# Patient Record
Sex: Female | Born: 1955
Health system: Southern US, Community
[De-identification: ages and names within clinical notes are randomized; demographics above are authoritative.]

## PROBLEM LIST (undated history)

## (undated) DIAGNOSIS — I10 Essential (primary) hypertension: Secondary | ICD-10-CM

## (undated) DIAGNOSIS — E119 Type 2 diabetes mellitus without complications: Secondary | ICD-10-CM

## (undated) DIAGNOSIS — E7800 Pure hypercholesterolemia, unspecified: Secondary | ICD-10-CM

## (undated) HISTORY — PX: CHOLECYSTECTOMY: SHX55

## (undated) HISTORY — PX: HERNIA REPAIR: SHX51

## (undated) HISTORY — PX: OTHER SURGICAL HISTORY: SHX169

## (undated) HISTORY — PX: COLONOSCOPY: SHX174

## (undated) HISTORY — PX: UPPER GASTROINTESTINAL ENDOSCOPY: SHX188

---

## 2012-08-28 DIAGNOSIS — R079 Chest pain, unspecified: Secondary | ICD-10-CM

## 2016-10-16 DIAGNOSIS — Z0001 Encounter for general adult medical examination with abnormal findings: Secondary | ICD-10-CM | POA: Diagnosis not present

## 2016-10-19 DIAGNOSIS — Z0001 Encounter for general adult medical examination with abnormal findings: Secondary | ICD-10-CM | POA: Diagnosis not present

## 2016-10-19 DIAGNOSIS — Z6834 Body mass index (BMI) 34.0-34.9, adult: Secondary | ICD-10-CM | POA: Diagnosis not present

## 2016-10-19 DIAGNOSIS — Z23 Encounter for immunization: Secondary | ICD-10-CM | POA: Diagnosis not present

## 2016-11-21 DIAGNOSIS — Z1231 Encounter for screening mammogram for malignant neoplasm of breast: Secondary | ICD-10-CM | POA: Diagnosis not present

## 2016-12-14 DIAGNOSIS — Z008 Encounter for other general examination: Secondary | ICD-10-CM | POA: Diagnosis not present

## 2016-12-14 DIAGNOSIS — Z6832 Body mass index (BMI) 32.0-32.9, adult: Secondary | ICD-10-CM | POA: Diagnosis not present

## 2016-12-14 DIAGNOSIS — E669 Obesity, unspecified: Secondary | ICD-10-CM | POA: Diagnosis not present

## 2016-12-14 DIAGNOSIS — Z719 Counseling, unspecified: Secondary | ICD-10-CM | POA: Diagnosis not present

## 2016-12-14 DIAGNOSIS — Z1389 Encounter for screening for other disorder: Secondary | ICD-10-CM | POA: Diagnosis not present

## 2017-03-27 DIAGNOSIS — Z6832 Body mass index (BMI) 32.0-32.9, adult: Secondary | ICD-10-CM | POA: Diagnosis not present

## 2017-03-27 DIAGNOSIS — Z008 Encounter for other general examination: Secondary | ICD-10-CM | POA: Diagnosis not present

## 2017-03-27 DIAGNOSIS — E669 Obesity, unspecified: Secondary | ICD-10-CM | POA: Diagnosis not present

## 2017-03-27 DIAGNOSIS — Z719 Counseling, unspecified: Secondary | ICD-10-CM | POA: Diagnosis not present

## 2017-05-08 DIAGNOSIS — Z7689 Persons encountering health services in other specified circumstances: Secondary | ICD-10-CM | POA: Diagnosis not present

## 2017-05-08 DIAGNOSIS — E669 Obesity, unspecified: Secondary | ICD-10-CM | POA: Diagnosis not present

## 2017-05-08 DIAGNOSIS — Z6831 Body mass index (BMI) 31.0-31.9, adult: Secondary | ICD-10-CM | POA: Diagnosis not present

## 2017-05-29 DIAGNOSIS — Z6831 Body mass index (BMI) 31.0-31.9, adult: Secondary | ICD-10-CM | POA: Diagnosis not present

## 2017-05-29 DIAGNOSIS — E669 Obesity, unspecified: Secondary | ICD-10-CM | POA: Diagnosis not present

## 2017-05-29 DIAGNOSIS — Z7689 Persons encountering health services in other specified circumstances: Secondary | ICD-10-CM | POA: Diagnosis not present

## 2017-06-12 DIAGNOSIS — E669 Obesity, unspecified: Secondary | ICD-10-CM | POA: Diagnosis not present

## 2017-06-12 DIAGNOSIS — Z7689 Persons encountering health services in other specified circumstances: Secondary | ICD-10-CM | POA: Diagnosis not present

## 2017-06-12 DIAGNOSIS — Z6831 Body mass index (BMI) 31.0-31.9, adult: Secondary | ICD-10-CM | POA: Diagnosis not present

## 2017-06-19 DIAGNOSIS — Z6831 Body mass index (BMI) 31.0-31.9, adult: Secondary | ICD-10-CM | POA: Diagnosis not present

## 2017-06-19 DIAGNOSIS — Z7689 Persons encountering health services in other specified circumstances: Secondary | ICD-10-CM | POA: Diagnosis not present

## 2017-06-19 DIAGNOSIS — E669 Obesity, unspecified: Secondary | ICD-10-CM | POA: Diagnosis not present

## 2017-07-24 ENCOUNTER — Emergency Department (HOSPITAL_COMMUNITY): Payer: BLUE CROSS/BLUE SHIELD

## 2017-07-24 ENCOUNTER — Other Ambulatory Visit: Payer: Self-pay

## 2017-07-24 ENCOUNTER — Encounter (HOSPITAL_COMMUNITY): Payer: Self-pay | Admitting: Emergency Medicine

## 2017-07-24 ENCOUNTER — Emergency Department (HOSPITAL_COMMUNITY)
Admission: EM | Admit: 2017-07-24 | Discharge: 2017-07-24 | Disposition: A | Discharge status: Home / Self Care | Payer: BLUE CROSS/BLUE SHIELD | Attending: Emergency Medicine | Admitting: Emergency Medicine

## 2017-07-24 DIAGNOSIS — R079 Chest pain, unspecified: Secondary | ICD-10-CM

## 2017-07-24 DIAGNOSIS — E119 Type 2 diabetes mellitus without complications: Secondary | ICD-10-CM | POA: Diagnosis not present

## 2017-07-24 DIAGNOSIS — R0789 Other chest pain: Secondary | ICD-10-CM | POA: Diagnosis not present

## 2017-07-24 DIAGNOSIS — I10 Essential (primary) hypertension: Secondary | ICD-10-CM | POA: Diagnosis not present

## 2017-07-24 HISTORY — DX: Type 2 diabetes mellitus without complications: E11.9

## 2017-07-24 HISTORY — DX: Essential (primary) hypertension: I10

## 2017-07-24 HISTORY — DX: Pure hypercholesterolemia, unspecified: E78.00

## 2017-07-24 LAB — BASIC METABOLIC PANEL
Anion gap: 13 (ref 5–15)
BUN: 13 mg/dL (ref 6–20)
CO2: 29 mmol/L (ref 22–32)
Calcium: 9.7 mg/dL (ref 8.9–10.3)
Chloride: 96 mmol/L — ABNORMAL LOW (ref 101–111)
Creatinine, Ser: 0.65 mg/dL (ref 0.44–1.00)
GFR calc Af Amer: >60 mL/min (ref >60)
GFR calc non Af Amer: >60 mL/min (ref >60)
Glucose, Bld: 97 mg/dL (ref 65–99)
Potassium: 3 mmol/L — ABNORMAL LOW (ref 3.5–5.1)
Sodium: 138 mmol/L (ref 135–145)

## 2017-07-24 LAB — CBC
HCT: 47.1 % — ABNORMAL HIGH (ref 36.0–46.0)
Hemoglobin: 14.9 g/dL (ref 12.0–15.0)
MCH: 28.2 pg (ref 26.0–34.0)
MCHC: 31.6 g/dL (ref 30.0–36.0)
MCV: 89.2 fL (ref 78.0–100.0)
Platelets: 312 10*3/uL (ref 150–400)
RBC: 5.28 MIL/uL — ABNORMAL HIGH (ref 3.87–5.11)
RDW: 12.8 % (ref 11.5–15.5)
WBC: 10.6 10*3/uL — ABNORMAL HIGH (ref 4.0–10.5)

## 2017-07-24 LAB — TROPONIN I: Troponin I: <0.03 ng/mL (ref <0.03)

## 2017-07-24 MED ORDER — POTASSIUM CHLORIDE CRYS ER 20 MEQ PO TBCR
60.0000 meq | EXTENDED_RELEASE_TABLET | Freq: Once | ORAL | Status: AC
  Administered 2017-07-24: 60 meq via ORAL
  Filled 2017-07-24: qty 3

## 2017-07-24 NOTE — ED Triage Notes (Signed)
Patient reports constant L sided chest pain x 3 days. Char as "soreness." No SOB, dizziness, no emesis. No diaphoresis. Patient stated she has felt weak.

## 2017-07-25 NOTE — ED Provider Notes (Signed)
[facility] EMERGENCY DEPARTMENT Provider Note   CSN: [id number] Arrival date & time: 07/24/17  1322     History   Chief Complaint Chief Complaint  Patient presents with  . Chest Pain    HPI Margaret Holland is a 62 y.o. female.  HPI   62 year old female with chest pain.  Constant for the past 3 days.  She cannot remember she was doing she first noticed it.  Describes the pain as a "soreness."  Left anterior chest.  Worse with certain movements and pressing her area.  No respiratory complaints.  No nausea, diaphoresis or palpitations.  No unusual leg pain or swelling.  Past Medical History:  Diagnosis Date  . Diabetes mellitus without complication (HCC)    Pre diabetic  . Hypercholesterolemia   . Hypertension     There are no active problems to display for this patient.   Past Surgical History:  Procedure Laterality Date  . CHOLECYSTECTOMY    . Ectopin pregnancy    . HERNIA REPAIR      OB History    Gravida Para Term Preterm AB Living   3 2 2   1      SAB TAB Ectopic Multiple Live Births       1           Home Medications    Prior to Admission medications   Not on File    Family History Family History  Problem Relation Age of Onset  . Diabetic kidney disease Mother   . Heart disease Mother   . Heart disease Father     Social History Social History   Tobacco Use  . Smoking status: Never Smoker  . Smokeless tobacco: Never Used  Substance Use Topics  . Alcohol use: Yes    Comment: occas  . Drug use: No     Allergies   Patient has no known allergies.   Review of Systems Review of Systems   Physical Exam Updated Vital Signs BP (!) 109/57   Pulse 74   Temp 97.6 F (36.4 C) (Oral) Comment: Patient drinking cold water prior to Triage  Resp 15   Ht 5\' 2"  (1.575 m)   Wt 80.3 kg (177 lb)   SpO2 98%   BMI 32.37 kg/m   Physical Exam  Constitutional: She appears well-developed and well-nourished. No distress.  HENT:  Head:  Normocephalic and atraumatic.  Eyes: Conjunctivae are normal. Right eye exhibits no discharge. Left eye exhibits no discharge.  Neck: Neck supple.  Cardiovascular: Normal rate, regular rhythm and normal heart sounds. Exam reveals no gallop and no friction rub.  No murmur heard. Pulmonary/Chest: Effort normal and breath sounds normal. No respiratory distress. She exhibits tenderness.  Mild tenderness to palpation left anterior chest a few centimeters below the clavicle near the midclavicular line.  No overlying skin changes.  Abdominal: Soft. She exhibits no distension. There is no tenderness.  Musculoskeletal: She exhibits no edema or tenderness.  Lower extremities symmetric as compared to each other. No calf tenderness. Negative Homan's. No palpable cords.   Neurological: She is alert.  Skin: Skin is warm and dry.  Psychiatric: She has a normal mood and affect. Her behavior is normal. Thought content normal.  Nursing note and vitals reviewed.    ED Treatments / Results  Labs (all labs ordered are listed, but only abnormal results are displayed) Labs Reviewed  BASIC METABOLIC PANEL - Abnormal; Notable for the following components:      Result Value  Potassium 3.0 (*)    Chloride 96 (*)    All other components within normal limits  CBC - Abnormal; Notable for the following components:   WBC 10.6 (*)    RBC 5.28 (*)    HCT 47.1 (*)    All other components within normal limits  TROPONIN I    EKG  EKG Interpretation  Date/Time:  Tuesday July 24 2017 13:29:04 EST Ventricular Rate:  75 PR Interval:  138 QRS Duration: 82 QT Interval:  372 QTC Calculation: 415 R Axis:   32 Text Interpretation:  Normal sinus rhythm Non-specific ST-t changes No old tracing to compare Confirmed by Raeford RazorKohut, Wednesday Ericsson [phone]([id number]) on 07/24/2017 5:31:30 PM       Radiology Dg Chest 2 View  Result Date: 07/24/2017 CLINICAL DATA:  LEFT chest pain for 3-4 days. History of hypertension. EXAM: CHEST  2  VIEW COMPARISON:  None. FINDINGS: Cardiomediastinal silhouette is normal. Mildly tortuous aorta associated with chronic hypertension. Mild bronchitic changes. No pleural effusions or focal consolidations. Trachea projects midline and there is no pneumothorax. Soft tissue planes and included osseous structures are non-suspicious. Osteopenia. Surgical clips in the included right abdomen compatible with cholecystectomy. IMPRESSION: Mild bronchitic changes without focal consolidation. Electronically Signed   By: Awilda Metroourtnay  Bloomer M.D.   On: 07/24/2017 14:26    Procedures Procedures (including critical care time)  Medications Ordered in ED Medications  potassium chloride SA (K-DUR,KLOR-CON) CR tablet 60 mEq (60 mEq Oral Given 07/24/17 1742)     Initial Impression / Assessment and Plan / ED Course  I have reviewed the triage vital signs and the nursing notes.  Pertinent labs & imaging results that were available during my care of the patient were reviewed by me and considered in my medical decision making (see chart for details).     62 year old female with chest pain.  Doubt ACS, PE, dissection or other emergent pathology.  May potentially be musculoskeletal given the reproducibility with certain movements and also with palpation.  ED workup fairly unremarkable.  Return precautions were discussed.  Final Clinical Impressions(s) / ED Diagnoses   Final diagnoses:  Chest pain, unspecified type    ED Discharge Orders    None       Raeford RazorKohut, Squire Withey, MD 07/25/17 2122

## 2017-07-30 DIAGNOSIS — M19012 Primary osteoarthritis, left shoulder: Secondary | ICD-10-CM | POA: Diagnosis not present

## 2017-07-30 DIAGNOSIS — M5412 Radiculopathy, cervical region: Secondary | ICD-10-CM | POA: Diagnosis not present

## 2017-07-30 DIAGNOSIS — M7542 Impingement syndrome of left shoulder: Secondary | ICD-10-CM | POA: Diagnosis not present

## 2017-07-30 DIAGNOSIS — Z6833 Body mass index (BMI) 33.0-33.9, adult: Secondary | ICD-10-CM | POA: Diagnosis not present

## 2017-08-29 DIAGNOSIS — K219 Gastro-esophageal reflux disease without esophagitis: Secondary | ICD-10-CM | POA: Diagnosis not present

## 2017-08-29 DIAGNOSIS — I10 Essential (primary) hypertension: Secondary | ICD-10-CM | POA: Diagnosis not present

## 2017-08-29 DIAGNOSIS — R079 Chest pain, unspecified: Secondary | ICD-10-CM | POA: Diagnosis not present

## 2017-08-29 DIAGNOSIS — M50122 Cervical disc disorder at C5-C6 level with radiculopathy: Secondary | ICD-10-CM | POA: Diagnosis not present

## 2017-08-29 DIAGNOSIS — M542 Cervicalgia: Secondary | ICD-10-CM | POA: Diagnosis not present

## 2017-08-29 DIAGNOSIS — M1990 Unspecified osteoarthritis, unspecified site: Secondary | ICD-10-CM | POA: Diagnosis not present

## 2017-08-29 DIAGNOSIS — E7800 Pure hypercholesterolemia, unspecified: Secondary | ICD-10-CM | POA: Diagnosis not present

## 2017-08-29 DIAGNOSIS — Z79899 Other long term (current) drug therapy: Secondary | ICD-10-CM | POA: Diagnosis not present

## 2017-10-18 DIAGNOSIS — Z0001 Encounter for general adult medical examination with abnormal findings: Secondary | ICD-10-CM | POA: Diagnosis not present

## 2017-11-12 DIAGNOSIS — M5412 Radiculopathy, cervical region: Secondary | ICD-10-CM | POA: Diagnosis not present

## 2017-11-12 DIAGNOSIS — M50321 Other cervical disc degeneration at C4-C5 level: Secondary | ICD-10-CM | POA: Diagnosis not present

## 2017-11-12 DIAGNOSIS — M50322 Other cervical disc degeneration at C5-C6 level: Secondary | ICD-10-CM | POA: Diagnosis not present

## 2017-11-12 DIAGNOSIS — M50323 Other cervical disc degeneration at C6-C7 level: Secondary | ICD-10-CM | POA: Diagnosis not present

## 2017-11-14 DIAGNOSIS — M542 Cervicalgia: Secondary | ICD-10-CM | POA: Diagnosis not present

## 2017-11-14 DIAGNOSIS — M5412 Radiculopathy, cervical region: Secondary | ICD-10-CM | POA: Diagnosis not present

## 2017-11-14 DIAGNOSIS — M5033 Other cervical disc degeneration, cervicothoracic region: Secondary | ICD-10-CM | POA: Diagnosis not present

## 2017-11-14 DIAGNOSIS — M4802 Spinal stenosis, cervical region: Secondary | ICD-10-CM | POA: Diagnosis not present

## 2018-01-04 DIAGNOSIS — Z1231 Encounter for screening mammogram for malignant neoplasm of breast: Secondary | ICD-10-CM | POA: Diagnosis not present

## 2018-11-01 DIAGNOSIS — Z0001 Encounter for general adult medical examination with abnormal findings: Secondary | ICD-10-CM | POA: Diagnosis not present

## 2018-11-05 DIAGNOSIS — Z23 Encounter for immunization: Secondary | ICD-10-CM | POA: Diagnosis not present

## 2018-11-05 DIAGNOSIS — Z6835 Body mass index (BMI) 35.0-35.9, adult: Secondary | ICD-10-CM | POA: Diagnosis not present

## 2018-11-05 DIAGNOSIS — Z0001 Encounter for general adult medical examination with abnormal findings: Secondary | ICD-10-CM | POA: Diagnosis not present

## 2019-01-16 DIAGNOSIS — Z23 Encounter for immunization: Secondary | ICD-10-CM | POA: Diagnosis not present

## 2020-11-09 ENCOUNTER — Other Ambulatory Visit (HOSPITAL_COMMUNITY): Payer: Self-pay | Admitting: Internal Medicine

## 2020-11-09 DIAGNOSIS — Z1231 Encounter for screening mammogram for malignant neoplasm of breast: Secondary | ICD-10-CM

## 2020-11-22 ENCOUNTER — Ambulatory Visit (HOSPITAL_COMMUNITY)
Admission: RE | Admit: 2020-11-22 | Discharge: 2020-11-22 | Disposition: A | Discharge status: Home / Self Care | Payer: 59 | Source: Ambulatory Visit | Attending: Internal Medicine | Admitting: Internal Medicine

## 2020-11-22 ENCOUNTER — Other Ambulatory Visit: Payer: Self-pay

## 2020-11-22 DIAGNOSIS — Z1231 Encounter for screening mammogram for malignant neoplasm of breast: Secondary | ICD-10-CM | POA: Insufficient documentation

## 2021-02-16 DIAGNOSIS — J302 Other seasonal allergic rhinitis: Secondary | ICD-10-CM | POA: Diagnosis not present

## 2021-02-16 DIAGNOSIS — R7303 Prediabetes: Secondary | ICD-10-CM | POA: Diagnosis not present

## 2021-02-16 DIAGNOSIS — K589 Irritable bowel syndrome without diarrhea: Secondary | ICD-10-CM | POA: Diagnosis not present

## 2021-02-16 DIAGNOSIS — I10 Essential (primary) hypertension: Secondary | ICD-10-CM | POA: Diagnosis not present

## 2021-05-17 DIAGNOSIS — K589 Irritable bowel syndrome without diarrhea: Secondary | ICD-10-CM | POA: Diagnosis not present

## 2021-05-17 DIAGNOSIS — J302 Other seasonal allergic rhinitis: Secondary | ICD-10-CM | POA: Diagnosis not present

## 2021-05-17 DIAGNOSIS — Z23 Encounter for immunization: Secondary | ICD-10-CM | POA: Diagnosis not present

## 2021-05-17 DIAGNOSIS — I10 Essential (primary) hypertension: Secondary | ICD-10-CM | POA: Diagnosis not present

## 2021-05-17 DIAGNOSIS — R7303 Prediabetes: Secondary | ICD-10-CM | POA: Diagnosis not present

## 2021-08-26 DIAGNOSIS — M810 Age-related osteoporosis without current pathological fracture: Secondary | ICD-10-CM | POA: Diagnosis not present

## 2021-08-26 DIAGNOSIS — M8589 Other specified disorders of bone density and structure, multiple sites: Secondary | ICD-10-CM | POA: Diagnosis not present

## 2021-11-07 DIAGNOSIS — Z13220 Encounter for screening for lipoid disorders: Secondary | ICD-10-CM | POA: Diagnosis not present

## 2021-11-07 DIAGNOSIS — E7800 Pure hypercholesterolemia, unspecified: Secondary | ICD-10-CM | POA: Diagnosis not present

## 2021-11-07 DIAGNOSIS — R7303 Prediabetes: Secondary | ICD-10-CM | POA: Diagnosis not present

## 2021-11-07 DIAGNOSIS — R739 Hyperglycemia, unspecified: Secondary | ICD-10-CM | POA: Diagnosis not present

## 2021-11-07 DIAGNOSIS — I10 Essential (primary) hypertension: Secondary | ICD-10-CM | POA: Diagnosis not present

## 2021-11-07 DIAGNOSIS — E1165 Type 2 diabetes mellitus with hyperglycemia: Secondary | ICD-10-CM | POA: Diagnosis not present

## 2021-11-07 DIAGNOSIS — E559 Vitamin D deficiency, unspecified: Secondary | ICD-10-CM | POA: Diagnosis not present

## 2021-11-07 DIAGNOSIS — Z1329 Encounter for screening for other suspected endocrine disorder: Secondary | ICD-10-CM | POA: Diagnosis not present

## 2021-11-14 DIAGNOSIS — I10 Essential (primary) hypertension: Secondary | ICD-10-CM | POA: Diagnosis not present

## 2021-11-14 DIAGNOSIS — R7303 Prediabetes: Secondary | ICD-10-CM | POA: Diagnosis not present

## 2021-11-14 DIAGNOSIS — Z0001 Encounter for general adult medical examination with abnormal findings: Secondary | ICD-10-CM | POA: Diagnosis not present

## 2021-11-14 DIAGNOSIS — K589 Irritable bowel syndrome without diarrhea: Secondary | ICD-10-CM | POA: Diagnosis not present

## 2021-11-14 DIAGNOSIS — J302 Other seasonal allergic rhinitis: Secondary | ICD-10-CM | POA: Diagnosis not present

## 2022-01-23 DIAGNOSIS — H25813 Combined forms of age-related cataract, bilateral: Secondary | ICD-10-CM | POA: Diagnosis not present

## 2022-01-23 DIAGNOSIS — Z7984 Long term (current) use of oral hypoglycemic drugs: Secondary | ICD-10-CM | POA: Diagnosis not present

## 2022-01-23 DIAGNOSIS — E119 Type 2 diabetes mellitus without complications: Secondary | ICD-10-CM | POA: Diagnosis not present

## 2022-02-21 DIAGNOSIS — Z1231 Encounter for screening mammogram for malignant neoplasm of breast: Secondary | ICD-10-CM | POA: Diagnosis not present

## 2022-03-18 IMAGING — MG MM DIGITAL SCREENING BILAT W/ TOMO AND CAD
8 series · 8 of 24 positions shown · non-contrast
Comparison: Previous exam(s).

CLINICAL DATA: Screening.

EXAM:
DIGITAL SCREENING BILATERAL MAMMOGRAM WITH TOMOSYNTHESIS AND CAD
TECHNIQUE: Bilateral screening digital craniocaudal and mediolateral oblique
mammograms were obtained. Bilateral screening digital breast
tomosynthesis was performed. The images were evaluated with
computer-aided detection.

[L CC synth-2D]
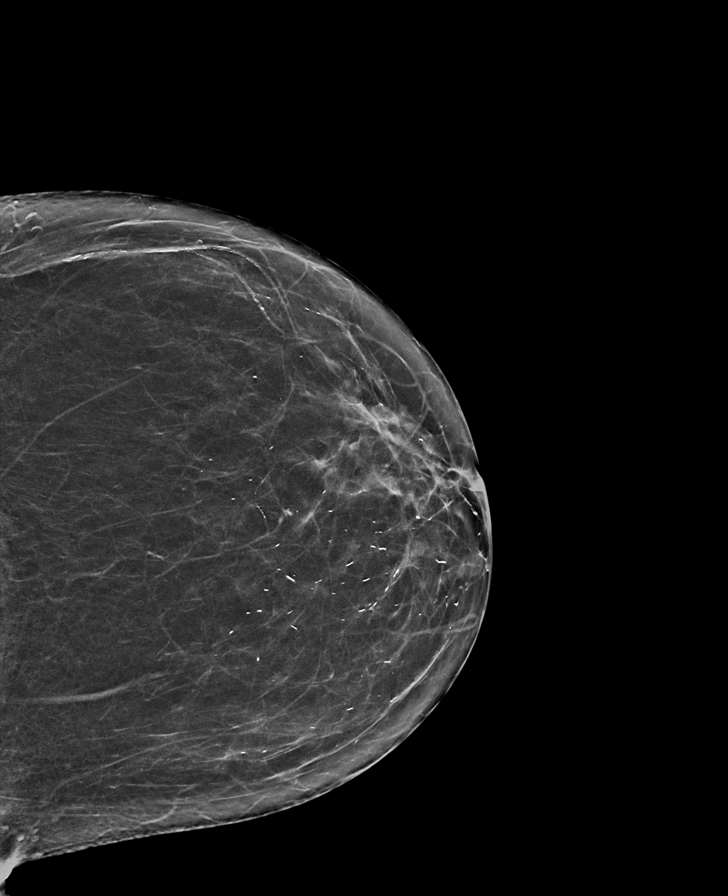

[R CC synth-2D]
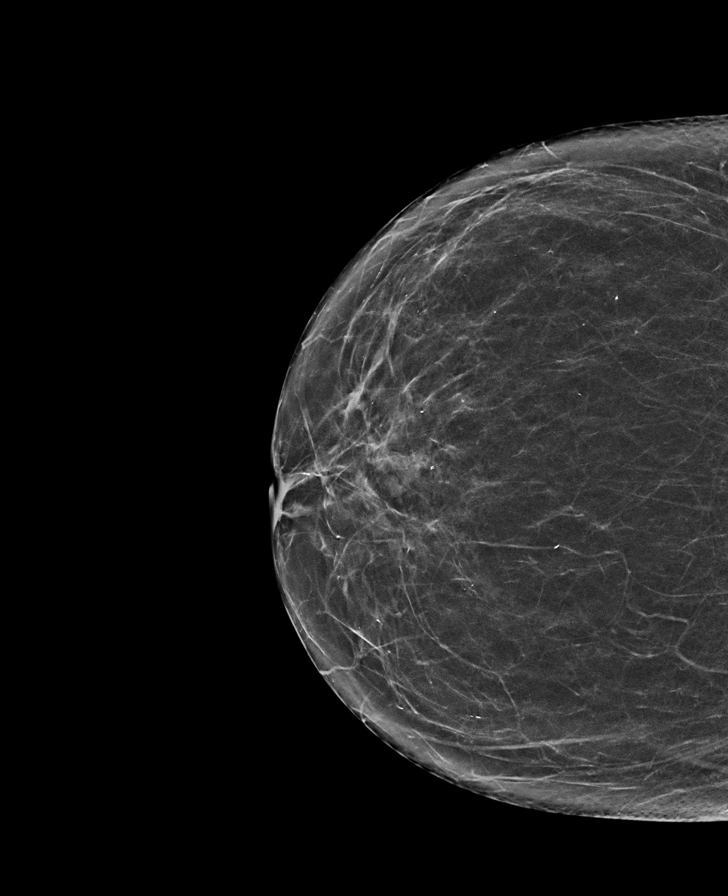

[L MLO synth-2D]
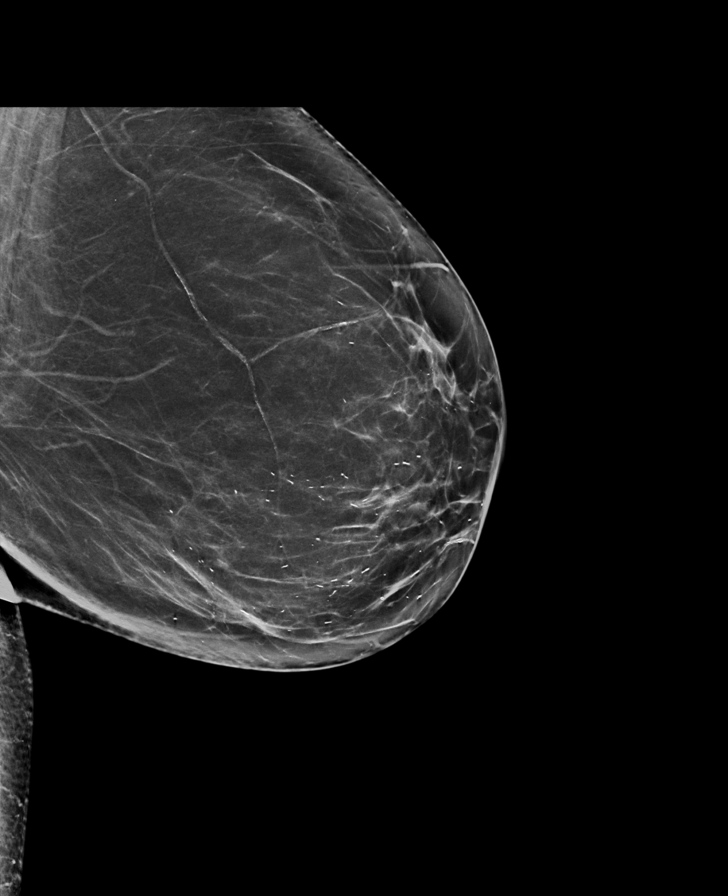

[R MLO synth-2D]
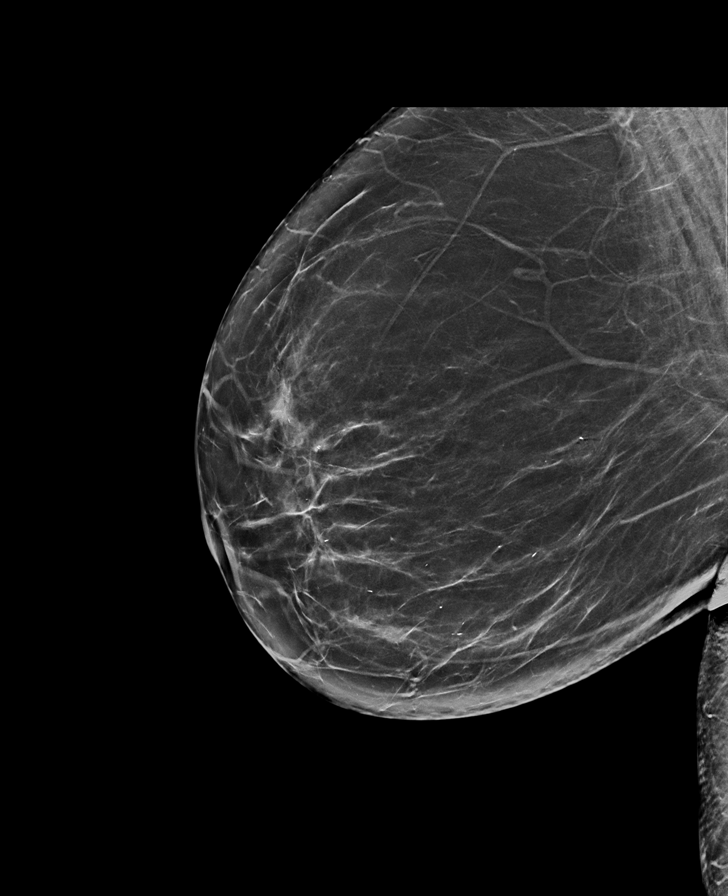

[R CC tomo · tomo slice 33/64.0]
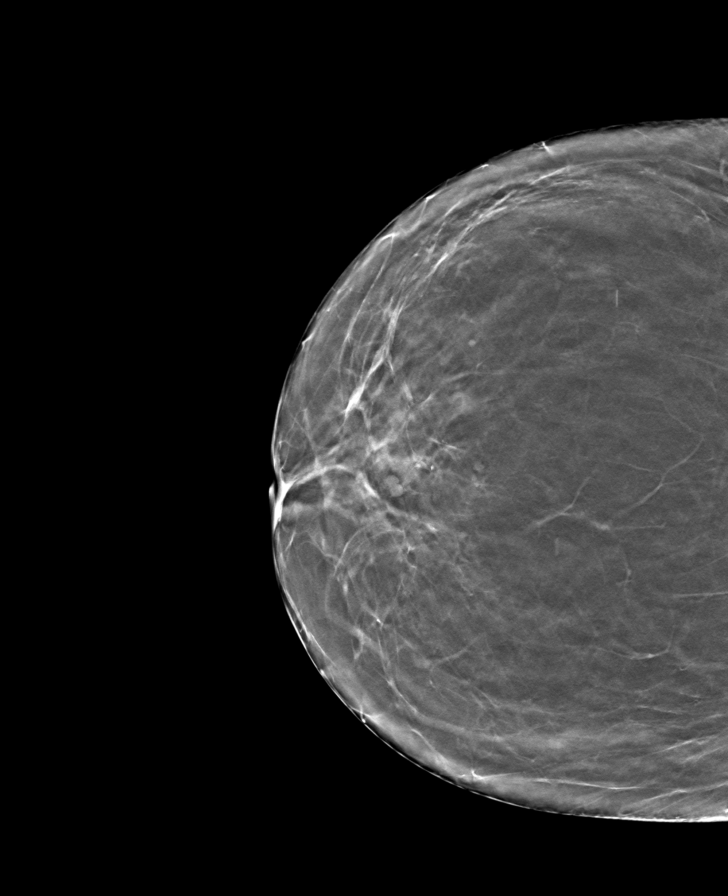

[L MLO tomo · tomo slice 39/76.0]
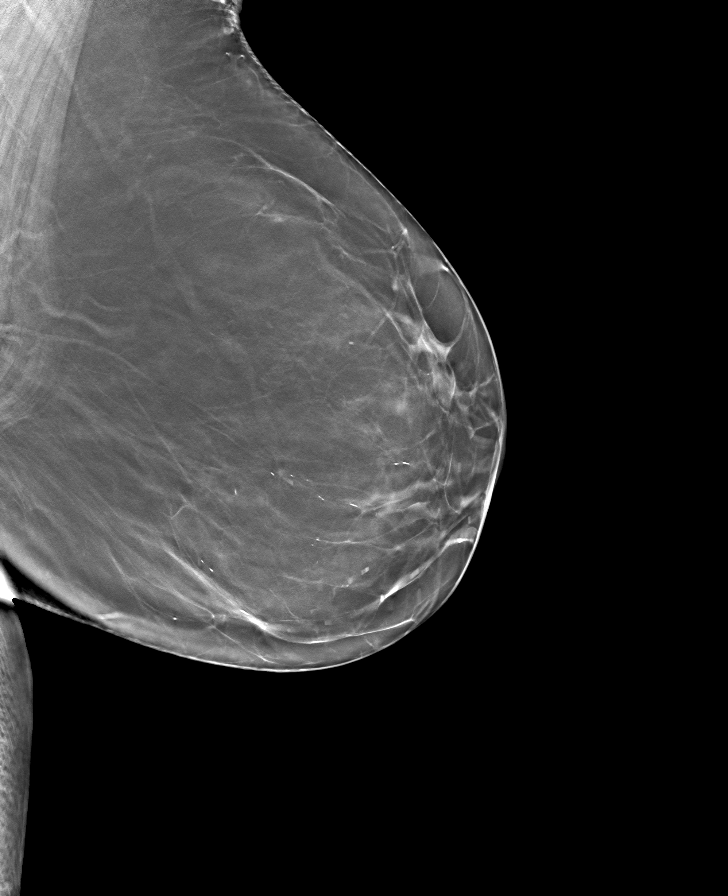

[L CC tomo · tomo slice 32/63.0]
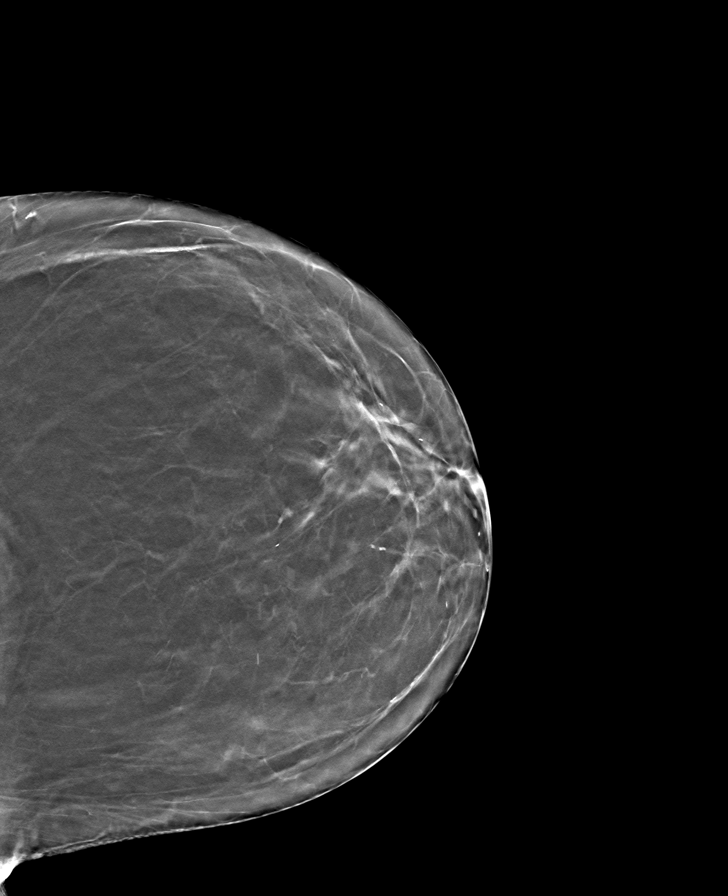

[R MLO tomo · tomo slice 39/76.0]
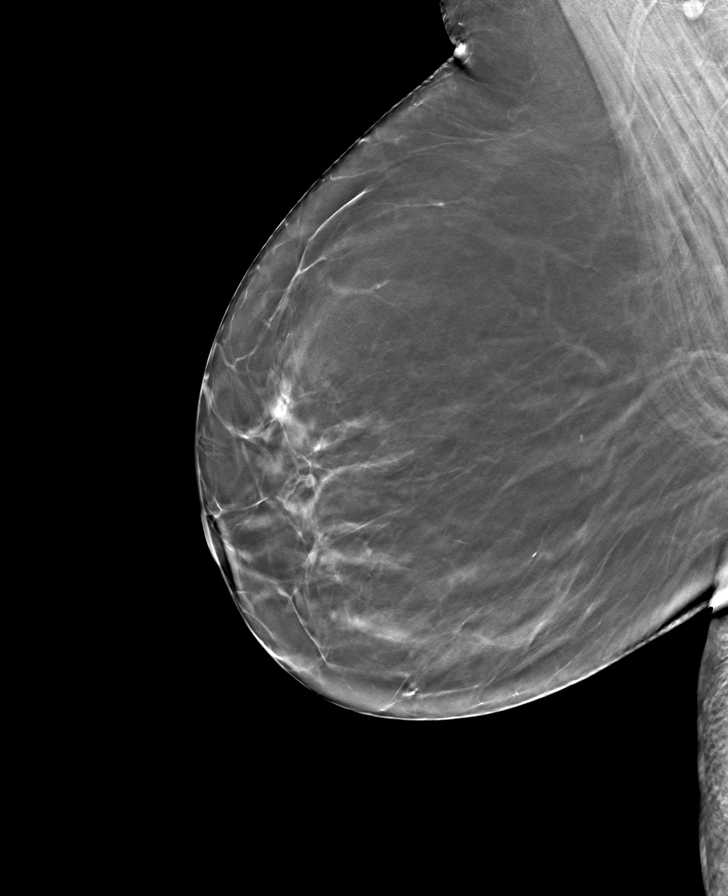

[8 of 24 positions shown; findings below may reference images not displayed]

ACR Breast Density Category b: There are scattered areas of
fibroglandular density.
FINDINGS: There are no findings suspicious for malignancy. The images were
evaluated with computer-aided detection.
IMPRESSION: No mammographic evidence of malignancy. A result letter of this
screening mammogram will be mailed directly to the patient.

RECOMMENDATION:
Screening mammogram in one year. (Code:WJ-[id number])

BI-RADS CATEGORY  1: Negative.

## 2022-05-17 DIAGNOSIS — E1165 Type 2 diabetes mellitus with hyperglycemia: Secondary | ICD-10-CM | POA: Diagnosis not present

## 2022-11-17 DIAGNOSIS — E559 Vitamin D deficiency, unspecified: Secondary | ICD-10-CM | POA: Diagnosis not present

## 2022-11-17 DIAGNOSIS — E1165 Type 2 diabetes mellitus with hyperglycemia: Secondary | ICD-10-CM | POA: Diagnosis not present

## 2022-11-17 DIAGNOSIS — Z13220 Encounter for screening for lipoid disorders: Secondary | ICD-10-CM | POA: Diagnosis not present

## 2022-11-17 DIAGNOSIS — I10 Essential (primary) hypertension: Secondary | ICD-10-CM | POA: Diagnosis not present

## 2022-11-21 DIAGNOSIS — Z0001 Encounter for general adult medical examination with abnormal findings: Secondary | ICD-10-CM | POA: Diagnosis not present

## 2022-11-21 DIAGNOSIS — J302 Other seasonal allergic rhinitis: Secondary | ICD-10-CM | POA: Diagnosis not present

## 2022-11-21 DIAGNOSIS — Z1389 Encounter for screening for other disorder: Secondary | ICD-10-CM | POA: Diagnosis not present

## 2022-11-21 DIAGNOSIS — I10 Essential (primary) hypertension: Secondary | ICD-10-CM | POA: Diagnosis not present

## 2022-11-21 DIAGNOSIS — Z23 Encounter for immunization: Secondary | ICD-10-CM | POA: Diagnosis not present

## 2022-11-21 DIAGNOSIS — K589 Irritable bowel syndrome without diarrhea: Secondary | ICD-10-CM | POA: Diagnosis not present

## 2022-11-21 DIAGNOSIS — R7303 Prediabetes: Secondary | ICD-10-CM | POA: Diagnosis not present

## 2023-01-01 DIAGNOSIS — Z1211 Encounter for screening for malignant neoplasm of colon: Secondary | ICD-10-CM | POA: Diagnosis not present

## 2023-02-22 DIAGNOSIS — K641 Second degree hemorrhoids: Secondary | ICD-10-CM | POA: Diagnosis not present

## 2023-02-22 DIAGNOSIS — Z1211 Encounter for screening for malignant neoplasm of colon: Secondary | ICD-10-CM | POA: Diagnosis not present

## 2023-02-22 DIAGNOSIS — K5730 Diverticulosis of large intestine without perforation or abscess without bleeding: Secondary | ICD-10-CM | POA: Diagnosis not present

## 2023-02-22 DIAGNOSIS — I10 Essential (primary) hypertension: Secondary | ICD-10-CM | POA: Diagnosis not present

## 2023-02-22 DIAGNOSIS — K649 Unspecified hemorrhoids: Secondary | ICD-10-CM | POA: Diagnosis not present

## 2023-02-22 DIAGNOSIS — K6289 Other specified diseases of anus and rectum: Secondary | ICD-10-CM | POA: Diagnosis not present

## 2023-02-22 DIAGNOSIS — Z79899 Other long term (current) drug therapy: Secondary | ICD-10-CM | POA: Diagnosis not present

## 2023-02-22 DIAGNOSIS — Z7984 Long term (current) use of oral hypoglycemic drugs: Secondary | ICD-10-CM | POA: Diagnosis not present

## 2023-02-22 DIAGNOSIS — E785 Hyperlipidemia, unspecified: Secondary | ICD-10-CM | POA: Diagnosis not present

## 2023-02-22 DIAGNOSIS — Z9049 Acquired absence of other specified parts of digestive tract: Secondary | ICD-10-CM | POA: Diagnosis not present

## 2023-02-22 DIAGNOSIS — E119 Type 2 diabetes mellitus without complications: Secondary | ICD-10-CM | POA: Diagnosis not present

## 2023-03-08 DIAGNOSIS — Z1231 Encounter for screening mammogram for malignant neoplasm of breast: Secondary | ICD-10-CM | POA: Diagnosis not present

## 2023-03-14 DIAGNOSIS — N6002 Solitary cyst of left breast: Secondary | ICD-10-CM | POA: Diagnosis not present

## 2023-03-14 DIAGNOSIS — R92322 Mammographic fibroglandular density, left breast: Secondary | ICD-10-CM | POA: Diagnosis not present

## 2023-03-14 DIAGNOSIS — R928 Other abnormal and inconclusive findings on diagnostic imaging of breast: Secondary | ICD-10-CM | POA: Diagnosis not present

## 2023-03-14 DIAGNOSIS — N6342 Unspecified lump in left breast, subareolar: Secondary | ICD-10-CM | POA: Diagnosis not present

## 2023-05-23 DIAGNOSIS — E1165 Type 2 diabetes mellitus with hyperglycemia: Secondary | ICD-10-CM | POA: Diagnosis not present

## 2023-09-10 DIAGNOSIS — N6489 Other specified disorders of breast: Secondary | ICD-10-CM | POA: Diagnosis not present

## 2023-09-10 DIAGNOSIS — R928 Other abnormal and inconclusive findings on diagnostic imaging of breast: Secondary | ICD-10-CM | POA: Diagnosis not present

## 2023-11-20 DIAGNOSIS — D559 Anemia due to enzyme disorder, unspecified: Secondary | ICD-10-CM | POA: Diagnosis not present

## 2023-11-20 DIAGNOSIS — I10 Essential (primary) hypertension: Secondary | ICD-10-CM | POA: Diagnosis not present

## 2023-11-20 DIAGNOSIS — Z13220 Encounter for screening for lipoid disorders: Secondary | ICD-10-CM | POA: Diagnosis not present

## 2023-11-20 DIAGNOSIS — Z1329 Encounter for screening for other suspected endocrine disorder: Secondary | ICD-10-CM | POA: Diagnosis not present

## 2023-11-20 DIAGNOSIS — E559 Vitamin D deficiency, unspecified: Secondary | ICD-10-CM | POA: Diagnosis not present

## 2023-11-20 DIAGNOSIS — E1165 Type 2 diabetes mellitus with hyperglycemia: Secondary | ICD-10-CM | POA: Diagnosis not present

## 2023-12-10 DIAGNOSIS — K589 Irritable bowel syndrome without diarrhea: Secondary | ICD-10-CM | POA: Diagnosis not present

## 2023-12-10 DIAGNOSIS — I10 Essential (primary) hypertension: Secondary | ICD-10-CM | POA: Diagnosis not present

## 2023-12-10 DIAGNOSIS — Z1389 Encounter for screening for other disorder: Secondary | ICD-10-CM | POA: Diagnosis not present

## 2023-12-10 DIAGNOSIS — R928 Other abnormal and inconclusive findings on diagnostic imaging of breast: Secondary | ICD-10-CM | POA: Diagnosis not present

## 2023-12-10 DIAGNOSIS — R7303 Prediabetes: Secondary | ICD-10-CM | POA: Diagnosis not present

## 2023-12-10 DIAGNOSIS — Z0001 Encounter for general adult medical examination with abnormal findings: Secondary | ICD-10-CM | POA: Diagnosis not present

## 2024-01-24 DIAGNOSIS — H04123 Dry eye syndrome of bilateral lacrimal glands: Secondary | ICD-10-CM | POA: Diagnosis not present

## 2024-01-24 DIAGNOSIS — H40033 Anatomical narrow angle, bilateral: Secondary | ICD-10-CM | POA: Diagnosis not present

## 2024-01-30 DIAGNOSIS — Z780 Asymptomatic menopausal state: Secondary | ICD-10-CM | POA: Diagnosis not present

## 2024-01-30 DIAGNOSIS — M810 Age-related osteoporosis without current pathological fracture: Secondary | ICD-10-CM | POA: Diagnosis not present

## 2024-02-22 DIAGNOSIS — H04123 Dry eye syndrome of bilateral lacrimal glands: Secondary | ICD-10-CM | POA: Diagnosis not present

## 2024-02-22 DIAGNOSIS — H40033 Anatomical narrow angle, bilateral: Secondary | ICD-10-CM | POA: Diagnosis not present

## 2024-03-24 DIAGNOSIS — R928 Other abnormal and inconclusive findings on diagnostic imaging of breast: Secondary | ICD-10-CM | POA: Diagnosis not present

## 2024-06-18 ENCOUNTER — Encounter (INDEPENDENT_AMBULATORY_CARE_PROVIDER_SITE_OTHER): Payer: Self-pay | Admitting: *Deleted

## 2024-07-21 ENCOUNTER — Ambulatory Visit (INDEPENDENT_AMBULATORY_CARE_PROVIDER_SITE_OTHER): Admitting: Gastroenterology

## 2024-07-21 ENCOUNTER — Encounter (INDEPENDENT_AMBULATORY_CARE_PROVIDER_SITE_OTHER): Payer: Self-pay | Admitting: Gastroenterology

## 2024-07-21 VITALS — BP 130/66 | HR 88 | Temp 98.2°F | Ht 62.0 in | Wt 183.6 lb

## 2024-07-21 DIAGNOSIS — K760 Fatty (change of) liver, not elsewhere classified: Secondary | ICD-10-CM | POA: Diagnosis not present

## 2024-07-21 DIAGNOSIS — Z6833 Body mass index (BMI) 33.0-33.9, adult: Secondary | ICD-10-CM | POA: Insufficient documentation

## 2024-07-21 DIAGNOSIS — R1012 Left upper quadrant pain: Secondary | ICD-10-CM | POA: Diagnosis not present

## 2024-07-21 DIAGNOSIS — R1033 Periumbilical pain: Secondary | ICD-10-CM | POA: Diagnosis not present

## 2024-07-21 DIAGNOSIS — R1030 Lower abdominal pain, unspecified: Secondary | ICD-10-CM | POA: Diagnosis not present

## 2024-07-21 DIAGNOSIS — K582 Mixed irritable bowel syndrome: Secondary | ICD-10-CM | POA: Insufficient documentation

## 2024-07-21 NOTE — Patient Instructions (Signed)
 It was very nice to meet you today, as dicussed with will plan for the following :  1)       - walking at a brisk pace/biking at moderate intesity 2.5-5 hours per week     - use pedometer/step counter to track activity     - goal to lose 5-10% of initial body weight     - avoid suagry drinks and juices, use zero calorie beverages     - increase water intake     - eat a low carb diet with plenty of veggies and fruit     - Get sufficient sleep 7-8 hrs nightly     - maitain active lifestyle     - avoid alcohol     - recommend 2-3 cups Coffee daily     - Counsel on lowering cholesterol by having a diet rich in vegetables,          protein (avoid red meats) and good fats(fish, salmon).    2) Low FODMAP diet

## 2024-07-21 NOTE — Progress Notes (Signed)
 Hadasah Brugger Faizan Diontre Harps , M.D. Gastroenterology & Hepatology [facility] [address] [place], [place] [address] Primary Care Physician: Trudy Vaughn FALCON, MD [address] [place] [address]  Chief Complaint: Fatty liver disease, abdominal pain   History of Present Illness: Margaret Holland is a 69 y.o. female with prediabetes, HLD , HTN who presents for evaluation of hepatic steatosis and abdominal pain  Patient reports that for the past year she has intermittent right upper quadrant pain also located periumbilical area and lower abdomen.  Pain is mostly present most of the week.  Relieved with defecation.  Bowel movement would range from [facility] stool scale type II-VI without any straining .Ultrasound work from 12/2023 demonstrated hepatic steatosis as per the referral The patient denies having any nausea, vomiting, fever, chills, hematochezia, melena, hematemesis, abdominal distention, abdominal pain, diarrhea, jaundice, pruritus or weight loss.  Patient reports she has been watching but she is eating and that has helped with her symptoms  Last Colonoscopy: 2024 at [facility]  Impression:            - Diverticulosis in the sigmoid colon.                         - No specimens collected.  Repeat 10 years   FHx: neg for any gastrointestinal/liver disease, no malignancies Social: neg smoking, alcohol or illicit drug use Surgical: Cholecystectomy  Labs from 02/12/2024 hemoglobin 14.5 platelet 317 ALT 18 AST 23 alk phos 59 T. bili 1.2 hemoglobin A1c 6.0  Past Medical History: Past Medical History:  Diagnosis Date   Diabetes mellitus without complication (HCC)    Pre diabetic   Hypercholesterolemia    Hypertension     Past Surgical History: Past Surgical History:  Procedure Laterality Date   CHOLECYSTECTOMY     COLONOSCOPY     Ectopin pregnancy     HERNIA REPAIR     UPPER GASTROINTESTINAL ENDOSCOPY      Family  History: Family History  Problem Relation Age of Onset   Diabetic kidney disease Mother    Heart disease Mother    Heart disease Father     Social History:Tobacco Use History[1] Social History   Substance and Sexual Activity  Alcohol Use Yes   Comment: occas   Social History   Substance and Sexual Activity  Drug Use No    Allergies: Allergies[2]  Medications: Current Outpatient Medications  Medication Sig Dispense Refill   pravastatin (PRAVACHOL) 40 MG tablet SMARTSIG:1 Tablet(s) By Mouth Every Evening (Patient taking differently: Take qod)     losartan-hydrochlorothiazide (HYZAAR) 100-25 MG tablet Take 1 tablet by mouth daily.     metFORMIN (GLUCOPHAGE-XR) 500 MG 24 hr tablet Take 500 mg by mouth 2 (two) times daily.     No current facility-administered medications for this visit.    Review of Systems: GENERAL: negative for malaise, night sweats HEENT: No changes in hearing or vision, no nose bleeds or other nasal problems. NECK: Negative for lumps, goiter, pain and significant neck swelling RESPIRATORY: Negative for cough, wheezing CARDIOVASCULAR: Negative for chest pain, leg swelling, palpitations, orthopnea GI: SEE HPI MUSCULOSKELETAL: Negative for joint pain or swelling, back pain, and muscle pain. SKIN: Negative for lesions, rash HEMATOLOGY Negative for prolonged bleeding, bruising easily, and swollen nodes. ENDOCRINE: Negative for cold or heat intolerance, polyuria, polydipsia and goiter. NEURO: negative for tremor, gait imbalance, syncope and seizures. The remainder of the  review of systems is noncontributory.   Physical Exam: BP 130/66   Pulse 88   Temp 98.2 F (36.8 C)   Ht 5' 2 (1.575 m)   Wt 183 lb 9.6 oz (83.3 kg)   BMI 33.58 kg/m  GENERAL: The patient is AO x3, in no acute distress. HEENT: Head is normocephalic and atraumatic. EOMI are intact. Mouth is well hydrated and without lesions. NECK: Supple. No masses LUNGS: Clear to auscultation.  No presence of rhonchi/wheezing/rales. Adequate chest expansion HEART: RRR, normal s1 and s2. ABDOMEN: Soft, nontender, no guarding, no peritoneal signs, and nondistended. BS +. No masses.  Imaging/Labs: as above     Latest Ref Rng & Units 07/24/2017    1:39 PM  CBC  WBC 4.0 - 10.5 K/uL 10.6   Hemoglobin 12.0 - 15.0 g/dL 85.0   Hematocrit 63.9 - 46.0 % 47.1   Platelets 150 - 400 K/uL 312    No results found for: IRON, TIBC, FERRITIN  I personally reviewed and interpreted the available labs, imaging and endoscopic files.  Impression and Plan: Margaret Holland is a 69 y.o. female with prediabetes, HLD , HTN who presents for evaluation of hepatic steatosis and abdominal pain  # Hepatic Steotosis   This is likely due to MASLD with risk factors of BMI 33 and metabolic syndrome with prediabetes hypertension hyperlipidemia   FIB 4: 1.30 *suggest advanced fibrosis excluded Although Fib-4 interpretation is not validated for people under 35 or over 33 years of age.   Steatosis-Associated Fibrosis Estimator (SAFE) Score  RESULT SUMMARY: 13 points SAFE Score  Intermediate risk Risk of moderate (F2+) fibrosis  On exam patient does not have signs of advanced chronic liver disease, no splenomegaly, ascites, spider angiomas, palmar eythema   Labs with platelet count more than 150 and hence unlikely patient has any advanced fibrosis or portal hypertension.  Last performed December with normal liver enzymes  At this time we can monitor liver enzymes and platelet count to 1 year and if any elevation in fib 4 score for liver enzymes we will pursue further management at that time. ELF testing with future labwork   Mainstay treatment at this time remains lifestyle modification       - walking at a brisk pace/biking at moderate intesity 2.5-5 hours per week     - use pedometer/step counter to track activity     - goal to lose 5-10% of initial body weight     - avoid suagry drinks and  juices, use zero calorie beverages     - increase water intake     - eat a low carb diet with plenty of veggies and fruit     - Get sufficient sleep 7-8 hrs nightly     - maitain active lifestyle     - avoid alcohol     - recommend 2-3 cups Coffee daily     - Counsel on lowering cholesterol by having a diet rich in vegetables,          protein (avoid red meats) and good fats(fish, salmon).  Will obtain ultrasound results from PCP office  #Abdominal discomfort   Patient has right upper quadrant and lower abdominal pain relieved with defecation with change in frequency and character of bowel movement likely irritable bowel syndrome unspecified type  Given mild right upper quadrant tenderness on exam today I advised patient cross-sectional but she would like to defer this for now imaging  - Explained presumed etiology of IBS symptoms.  Patient was counseled about the benefit of implementing a low FODMAP to improve symptoms and recurrent episodes. A dietary list was provided to the patient. Also, the patient was counseled about the benefit of avoiding stressing situations and potential environmental triggers leading to symptomatology. -IB guard    If pain persists will obtain CT abdomen pelvis  #HCM  Patient is up to date to colonoscopy 2024 at [facility] suggest repeat 10 years  All questions were answered.      Kaj Vasil Faizan Caral Whan, MD Gastroenterology and Hepatology [facility]   This chart has been completed using [facility] Dictation software, and while attempts have been made to ensure accuracy , certain words and phrases may not be transcribed as intended      [1]  Social History Tobacco Use  Smoking Status Never  Smokeless Tobacco Never  [2] No Known Allergies
